# Patient Record
Sex: Male | Born: 1980 | Hispanic: Yes | Marital: Married | Smoking: Current every day smoker
Health system: Southern US, Community
[De-identification: ages and names within clinical notes are randomized; demographics above are authoritative.]

---

## 2014-06-09 ENCOUNTER — Emergency Department (HOSPITAL_COMMUNITY)
Admission: EM | Admit: 2014-06-09 | Discharge: 2014-06-09 | Disposition: A | Discharge status: Home / Self Care | Payer: Self-pay | Attending: Emergency Medicine | Admitting: Emergency Medicine

## 2014-06-09 ENCOUNTER — Encounter (HOSPITAL_COMMUNITY): Payer: Self-pay | Admitting: *Deleted

## 2014-06-09 ENCOUNTER — Emergency Department (HOSPITAL_COMMUNITY): Payer: Self-pay

## 2014-06-09 ENCOUNTER — Telehealth (HOSPITAL_COMMUNITY): Payer: Self-pay

## 2014-06-09 DIAGNOSIS — N451 Epididymitis: Secondary | ICD-10-CM | POA: Insufficient documentation

## 2014-06-09 DIAGNOSIS — R1909 Other intra-abdominal and pelvic swelling, mass and lump: Secondary | ICD-10-CM

## 2014-06-09 DIAGNOSIS — N4400 Torsion of testis, unspecified: Secondary | ICD-10-CM

## 2014-06-09 DIAGNOSIS — Z720 Tobacco use: Secondary | ICD-10-CM | POA: Insufficient documentation

## 2014-06-09 DIAGNOSIS — B3749 Other urogenital candidiasis: Secondary | ICD-10-CM | POA: Insufficient documentation

## 2014-06-09 LAB — URINALYSIS, ROUTINE W REFLEX MICROSCOPIC
BILIRUBIN URINE: NEGATIVE
Glucose, UA: NEGATIVE mg/dL
HGB URINE DIPSTICK: NEGATIVE
Ketones, ur: NEGATIVE mg/dL
LEUKOCYTES UA: NEGATIVE
NITRITE: NEGATIVE
Protein, ur: NEGATIVE mg/dL
SPECIFIC GRAVITY, URINE: 1.025 (ref 1.005–1.030)
UROBILINOGEN UA: 0.2 mg/dL (ref 0.0–1.0)
pH: 5.5 (ref 5.0–8.0)

## 2014-06-09 MED ORDER — DOXYCYCLINE HYCLATE 100 MG PO TABS
100.0000 mg | ORAL_TABLET | Freq: Once | ORAL | Status: AC
  Administered 2014-06-09: 100 mg via ORAL
  Filled 2014-06-09: qty 1

## 2014-06-09 MED ORDER — CLOTRIMAZOLE 1 % EX CREA: TOPICAL_CREAM | CUTANEOUS | Status: AC

## 2014-06-09 MED ORDER — CEFTRIAXONE SODIUM 250 MG IJ SOLR
250.0000 mg | Freq: Once | INTRAMUSCULAR | Status: AC
  Administered 2014-06-09: 250 mg via INTRAMUSCULAR
  Filled 2014-06-09: qty 250

## 2014-06-09 MED ORDER — LIDOCAINE HCL (PF) 1 % IJ SOLN: 1.0000 mL | Freq: Once | INTRAMUSCULAR | Status: DC

## 2014-06-09 MED ORDER — DOXYCYCLINE HYCLATE 100 MG PO CAPS: 100.0000 mg | ORAL_CAPSULE | Freq: Two times a day (BID) | ORAL | Status: AC

## 2014-06-09 MED ORDER — LIDOCAINE HCL (PF) 1 % IJ SOLN
INTRAMUSCULAR | Status: AC
  Administered 2014-06-09: 1 mL
  Filled 2014-06-09: qty 5

## 2014-06-09 NOTE — Telephone Encounter (Signed)
Pharmacy calling regarding pt Rx for Doxycycline.  Pt has no insurance and Rx $67.00.  Provided pharmacy # for case manager to see if qualifies for match program.

## 2014-06-09 NOTE — ED Notes (Addendum)
Pt sent here from [facility] for bil scrotal itching and swelling since yesterday. Pt states only pain was when examiner palpated L testicle.  Denies changes in urinary habits, though states hx of urinary urgency x years.

## 2014-06-09 NOTE — ED Notes (Signed)
Called lab re ua results.

## 2014-06-09 NOTE — Discharge Instructions (Signed)
Return to the emergency room with worsening of symptoms, new symptoms or with symptoms that are concerning, especially severe testicular pain, increased redness, swelling, penile discharge, worsening rash. Please take all of your antibiotics until finished!   You may develop abdominal discomfort or diarrhea from the antibiotic.  You may help offset this with probiotics which you can buy or get in yogurt. Do not eat  or take the probiotics until 2 hours after your antibiotic.  Apply antifungal cream to genitals twice daily. Please call your doctor for a followup appointment within 24-48 hours. When you talk to your doctor please let them know that you were seen in the emergency department and have them acquire all of your records so that they can discuss the findings with you and formulate a treatment plan to fully care for your new and ongoing problems. If you do not have a primary care provider please call the number below under ED resources to establish care with a provider and follow up.  Read below information and follow recommendations.   Candidiasis cutnea  (Cutaneous Candidiasis) La candidiasis cutnea es un trastorno en el que hay un desarrollo excesivo de hongos (Cndida) en la piel. Los hongos normalmente viven en la piel, pero en pequeas cantidades y no causan ningn sntoma. En ciertos casos, un mayor desarrollo de los hongos puede causar una verdadera infeccin por hongos. Este tipo de infeccin ocurre generalmente en reas de la piel que son constantemente clidas y [place], como las axilas o la ingle. El hongo es la causa ms comn de dermatitis del paal en los bebs y en personas que no pueden controlar sus movimientos intestinales (incontinencia).  CAUSAS  El hongo que causa candidiasis cutnea con ms frecuencia es Candida albicans. Las [employer] pueden aumentar el riesgo de contraer una infeccin por hongos en la piel son:   Janene Harvey.  Embarazo.  Diabetes.  Tomar  antibiticos.  Tomar pldoras anticonceptivas.  Tomar corticoides.  La enfermedad tiroidea.  Una deficiencia de hierro o zinc.  Problemas del sistema inmunolgico. SNTOMAS   Zona de la piel roja e hinchada.  Bultos en la piel.  Picazn. DIAGNSTICO  El diagnstico de la candidiasis cutnea se basa generalmente en su apariencia. Podrn realizarle unos ligeros raspados en la piel que se observarn bajo un microscopio para determinar la presencia de hongos.  TRATAMIENTO  Cremas antimicticas pueden aplicarse sobre la piel infectada. En los casos graves, pueden ser necesario tomar medicamentos por va oral.  INSTRUCCIONES PARA EL CUIDADO EN EL HOGAR   Mantenga la piel limpia y [place].  Mantenga un peso saludable.  Si tiene diabetes, mantenga bajo control el nivel de Banker. SOLICITE ATENCIN MDICA DE INMEDIATO SI:   Su erupcin contina extendindose a pesar del tratamiento.  Tiene fiebre, siente escalofros o dolor abdominal. Document Released: 12/17/2010 Document Revised: 03/22/2011 ExitCare Patient Information 2015 [place], [place]. This information is not intended to replace advice given to you by your health care provider. Make sure you discuss any questions you have with your health care provider. Epididimitis (Epididymitis) La epididimitis es una inflamacin (reaccin del organismo a una lesin o infeccin) del epiddimo. El epiddimo es [place] estructura similar una cuerda ubicada en la parte posterior de los testculos. Generalmente la causa es una infeccin, aunque no siempre. Generalmente se trata de un trastorno sbito, que comienza con escalofros, fiebre y Engineer, mining detrs del escroto y en el testculo. Puede haber hinchazn y enrojecimiento de los testculos. DIAGNSTICO El examen fsico  puede revelar un epiddimo sensible e hinchado. Los cultivos de [place] y de las secreciones prostticas ayudarn a Production assistant, radio causa de la infeccin. Algunas veces se practica  un anlisis de sangre para observar si el recuento de glbulos blancos es elevado y si se trata de una infeccin bacteriana (grmenes) o viral. Con estos datos, el profesional que lo asiste podr Psychologist, counselling un antibitico (medicamentos que destruyen los grmenes) adecuado para la infeccin bacteriana. La infeccin viral que ocasiona la epididimitis generalmente se resolver sin tratamiento. INSTRUCCIONES PARA EL CUIDADO DOMICILIARIO  Para aliviar el dolor, tome baos de asiento calientes durante 20 minutos, cuatro veces por Futures trader.  Utilice los medicamentos de venta libre o de prescripcin para Chief Technology Officer, Environmental health practitioner o la [place], segn se lo indique el profesional que lo asiste.  Tome la medicacin, incluidos los antibiticos, como se le indic. Tome los antibiticos durante todo el tiempo que le han indicado, aun si se siente mejor.  Es muy importante concurrir a todas las citas para Animator. SOLICITE ATENCIN MDICA DE INMEDIATO SI:  Tiene fiebre.  El dolor no se alivia con los [employer].  Los sntomas (problemas) que originalmente lo trajeron a Stage manager.  El dolor puede aparecer y Geneticist, molecular.  Comienza a Financial risk analyst, observa enrojecimiento e hinchazn en el escroto y en las zonas que lo rodean. EST SEGURO QUE:  Comprende las instrucciones para el alta mdica.  Controlar su enfermedad.  Solicitar atencin mdica de inmediato segn las indicaciones. Document Released: 12/28/2004 Document Revised: 03/22/2011 [facility] Patient Information 2015 [place], [place]. This information is not intended to replace advice given to you by your health care provider. Make sure you discuss any questions you have with your health care provider.    Emergency Department Resource Guide 1) Find a Doctor and Pay Out of Pocket Although you won't have to find out who is covered by your insurance plan, it is a good idea to ask around and get recommendations. You will then need to call  the office and see if the doctor you have chosen will accept you as a new patient and what types of options they offer for patients who are self-pay. Some doctors offer discounts or will set up payment plans for their patients who do not have insurance, but you will need to ask so you aren't surprised when you get to your appointment.  2) Contact Your Local Health Department Not all health departments have doctors that can see patients for sick visits, but many do, so it is worth a call to see if yours does. If you don't know where your local health department is, you can check in your phone book. The CDC also has a tool to help you locate your state's health department, and many state websites also have listings of all of their local health departments.  3) Find a Walk-in Clinic If your illness is not likely to be very severe or complicated, you may want to try a walk in clinic. These are popping up all over the country in pharmacies, drugstores, and shopping centers. They're usually staffed by nurse practitioners or physician assistants that have been trained to treat common illnesses and complaints. They're usually fairly quick and inexpensive. However, if you have serious medical issues or chronic medical problems, these are probably not your best option.  No Primary Care Doctor: - Call Health Connect at  [phone] - they can help you locate a primary care doctor that  accepts your insurance, provides certain  services, etc. - Physician Referral Service- [phone]  Chronic Pain Problems: Organization         Address  Phone   Notes  [facility]  [phone] Patients need to be referred by their primary care doctor.   Medication Assistance: Organization         Address  Phone   Notes  [facility] Medication [facility] [address] [place] [place], [place] [address] [phone] --Must be a resident of [facility] -- Must have NO insurance  coverage whatsoever (no Medicaid/ Medicare, etc.) -- The pt. MUST have a primary care doctor that directs their care regularly and follows them in the community   MedAssist  [phone]   [employer]  [phone]    Agencies that provide inexpensive medical care: Organization         Address  Phone   Notes  [facility]  [phone]   [facility]    [phone]   [facility] [address] [place], [place] [address] [phone]   [facility] [address] [place], [place] [phone]   Planned Parenthood    [phone]   [facility]    [phone]   [facility]  [address], [place] Phone:  [phone], Fax:  [phone] Hours of Operation:  9 am - 6 pm, M-F.  Also accepts Medicaid/Medicare and self-pay.  [facility]  [address], [place] Phone: [phone], Fax: [phone]. Hours of Operation:  8:30 am - 5:30 pm, M-F.  Also accepts Medicaid and self-pay.  [facility] [place] [address], [place] Phone: [phone]   [facility] [address] Natasha Bence [place], [place] [phone], Ext. [phone] Mondays & Thursdays: 7-9 AM.  First 15 patients are seen on a first come, first serve basis.    Medicaid-accepting [facility] Providers:  Organization         Address  Phone   Notes  [facility] [address], [place] [phone] Also accepts self-pay patients.  [facility] [address] Laurell [place], [place]  [phone]   [facility] [address], [place] [phone]   [facility] [address] [place] [phone]   Renaye Rakers [address], [place]   [phone] Only accepts [place] Access [place] patients after they have their  name applied to their card.   Self-Pay (no insurance) in [facility]:  Organization         Address  Phone   Notes  Sickle Cell Patients, [facility] Internal Medicine [address] [place], [place] [phone]   [facility] [address] [place], [place] [phone]   [facility] [place] [facility]  [address], [place] [phone]   [facility]/Dr. Osei-Bonsu  [address] [place] or [address], [place] [phone] Phone number for both [place] and [place] locations is the same.  Urgent Medical and [facility] [address], [place] [phone]   [facility] [address], [place] or [address] [phone]   [facility] [address],  [place] [phone], phone; [phone], fax Sees patients 1st and 3rd Saturday of every month.  Must not qualify for public or private insurance (i.e. Medicaid, Medicare, [place] Health Choice, Veterans' Benefits)  Household income should be no more than 200% of the poverty level The clinic cannot treat you if you are pregnant or think you are pregnant  Sexually transmitted diseases are not treated at the clinic.    Dental Care: Organization         Address  Phone  Notes  [facility] Department of [facility] [address] [place], [place] [phone] Accepts children up to age 70 who are enrolled in [place] or [place] Health Choice; pregnant women with a Medicaid card; and children who have applied for Medicaid or [place] Health Choice, but were declined, whose parents can pay a reduced fee at time of service.  [facility] Department of [facility]  [address], [place] [phone] Accepts children up to age 29 who are enrolled in [place] or [place] Health Choice; pregnant women with a Medicaid card; and children who have applied for  Medicaid or [place] Health Choice, but were declined, whose parents can pay a reduced fee at time of service.  [facility]  [address] [place], [place] [phone] Patients are seen by appointment only. Walk-ins are not accepted. [facility] will see patients 52 years of age and older. Monday - Tuesday (8am-5pm) Most Wednesdays (8:30-5pm) $30 per visit, cash only  [facility]  [address], [facility] [phone] Patients are seen by appointment only. Walk-ins are not accepted. [facility] will see patients 16 years of age and older. One Wednesday Evening (Monthly: Volunteer Based).  $30 per visit, cash only  [employer]  [phone] for adults; Children under age 87, call Graduate Pediatric Dentistry at [phone]. Children aged 47-14, please call [phone] to request a pediatric application.  Dental services are provided in all areas of dental care including fillings, crowns and bridges, complete and partial dentures, implants, gum treatment, root canals, and extractions. Preventive care is also provided. Treatment is provided to both adults and children. Patients are selected via a lottery and there is often a waiting list.   [facility] [address] [place]  [phone] [email or web address]   [facility] [address] [place], [place] [phone], Ext. [phone] Second and Fourth Thursday of each month, opens at 6:30 AM; Clinic ends at 9 AM.  Patients are seen on a first-come first-served basis, and a limited number are seen during each clinic.   [facility]  [address] Ether [place]ins [place], [place] [phone]   Eligibility Requirements You must have lived in [place], [place], or [place] counties for at least the last three months.   You cannot be eligible for state or federal sponsored [place], including [employer], [place],  or [employer].   You generally cannot be eligible for healthcare insurance through your employer.    How to apply: Eligibility screenings are held every Tuesday and Wednesday afternoon from 1:00 pm until 4:00 pm. You do not need an appointment for the interview!  [facility] [address], [place], [place] [phone]   [facility] Health Department  [phone]   [facility] Health Department  [phone]   [facility] Health Department  [phone]    Behavioral  Health Resources in the Community: Intensive Outpatient Programs Organization         Address  Phone  Notes  [facility] [address] [place], [place] [phone]   [facility] Outpatient [address] [place], [place] [phone]   [facility] [address] [place], [place]  [phone]   [facility] [address]  [place], [place] [phone] or [phone]   Substance Abuse Resources Organization         Address  Phone  Notes  [facility]  [phone]   [facility]  [phone]   [facility]  [phone]   Floydene Flock  [phone]   Residential & Outpatient Substance Abuse Program  [phone]   Psychological Services Organization         Address  Phone  Notes  [facility] Behavioral Health  [phone]   [facility] Services  [phone]   [facility] Mental Health [address] [place] [phone] or [phone]    Mobile Crisis Teams Organization         Address  Phone  Notes  [facility], Mobile Crisis Care Unit  [phone]   [facility]  [address] [place], [place] [phone]   Doristine Locks [address] [place] [phone]    Self-Help/Support Groups Organization         Address  Phone             Notes  [facility] [place] - variety of support groups   336- I7437963 Call for more information  Narcotics Anonymous (NA), [facility] [address], [employer]   2 meetings at this location   Statistician         Address  Phone  Notes  [facility] [address],    [place]  [phone]   [facility]  [address] [place] [address], [place], [place] [phone]   [facility] [address] [place], [place] [phone] Admissions: 8am-3pm M-F  [facility] [address],    [place], [place] [phone]   [facility] [address] Tehillah Cipriani, [place], [place] [phone]   [facility] [address]  [place], [place] [phone]   [facility] - Intensive Outpatient [address] Laurell Joseph[address], [place], [place] [phone]   [facility] ([facility] [address] [place]  [place], [place] [phone] or [phone]   [facility] ([facility]) [address] [place], [place] [phone] Accepts Medicaid  [facility] [address],  [place] [phone] Substance Abuse/Addiction Treatment   [facility] Organization         Address  Phone  Notes  [facility]  [phone]   Angie Fava, PhD [address] Ervin Knack [place], [place]   [phone] or [phone]   [facility]   [address] [place], [place] [phone]   [facility] [address] [place], [place] [phone] Insurance/Medicaid/sponsorship through [employer] and Families [address]                                    [place], [place] [phone] Therapy/tele-psych/case  [facility] [address]   [place], [place] (  336) [phone]    Dr. Lolly MustacheArfeen  [phone]) [phone]   [facility]  [employer] [facility] 1) [address], [place] 2) [address], [place] 3)  [address], [place] [phone](336) [phone]  [phone]) [phone]   [facility] Child Abuse Hotline [phone](336) [phone] or [phone](336) [phone] (After Hours)

## 2014-06-09 NOTE — ED Provider Notes (Signed)
CSN: [id number]     Arrival date & time 06/09/14  1104 History   First MD Initiated Contact with Patient 06/09/14 1122     Chief Complaint  Patient presents with  . Groin Swelling     (Consider location/radiation/quality/duration/timing/severity/associated sxs/prior Treatment) HPI  Stephen Terrell is a 34 y.o. male presenting with new onset groin swelling and erythema since yesterday evening with associated pain that started today. Patient states the swelling and erythema has improved without treatment. Patient has not taken anything for his symptoms. [facility] for [facility]. He denies fevers, chills, abdominal pain, nausea, vomiting, diarrhea. No penile lesions or discharge. No testicular lesions. Patient sexually active with wife without condom use.   History reviewed. No pertinent past medical history. History reviewed. No pertinent past surgical history. No family history on file. History  Substance Use Topics  . Smoking status: Current Every Day Smoker -- 0.25 packs/day    Types: Cigarettes  . Smokeless tobacco: Not on file  . Alcohol Use: Yes     Comment: occ    Review of Systems 10 Systems reviewed and are negative for acute change except as noted in the HPI.    Allergies  Review of patient's allergies indicates no known allergies.  Home Medications   Prior to Admission medications   Medication Sig Start Date End Date Taking? Authorizing Provider  clotrimazole (LOTRIMIN) 1 % cream Apply to affected area 2 times daily 06/09/14   [place] Anieya Helman, PA-C  doxycycline (VIBRAMYCIN) 100 MG capsule Take 1 capsule (100 mg total) by mouth 2 (two) times daily. 06/09/14   Oswaldo ConroyVictoria Axel Frisk, PA-C   BP 115/68 mmHg  Pulse 74  Temp(Src) 98.2 F (36.8 C) (Oral)  Resp 16  Ht 5\' 6"  (1.676 m)  Wt 163 lb (73.936 kg)  BMI 26.32 kg/m2  SpO2 98% Physical Exam  Constitutional: He appears well-developed and well-nourished. No distress.  HENT:   Head: Normocephalic and atraumatic.  Eyes: Conjunctivae and EOM are normal. Right eye exhibits no discharge. Left eye exhibits no discharge.  Cardiovascular: Normal rate and regular rhythm.   Pulmonary/Chest: Effort normal and breath sounds normal. No respiratory distress. He has no wheezes.  Abdominal: Soft. Bowel sounds are normal. He exhibits no distension. There is no tenderness.  Genitourinary:  Normal appearing uncircumcised penis without erythema, lesions, swelling. No penile discharge. Scrotum with edema, erythema worse on left with tenderness to palpation of L testicle. Erythematous rash in skin folds.  No penile or scrotal lesions. Normal testicular lie.   Neurological: He is alert. He exhibits normal muscle tone. Coordination normal.  Skin: Skin is warm and dry. He is not diaphoretic.  Nursing note and vitals reviewed.   ED Course  Procedures (including critical care time) Labs Review Labs Reviewed  RPR  URINALYSIS, ROUTINE W REFLEX MICROSCOPIC (NOT [facility])  HIV ANTIBODY (ROUTINE TESTING)  GC/CHLAMYDIA PROBE AMP ([place]) NOT AT [facility]    Imaging Review [place] Scrotum  06/09/2014   CLINICAL DATA:  Bilateral scrotal and testicular pain.  EXAM: SCROTAL ULTRASOUND  DOPPLER ULTRASOUND OF THE TESTICLES  TECHNIQUE: Complete ultrasound examination of the testicles, epididymis, and other scrotal structures was performed. Color and spectral Doppler ultrasound were also utilized to evaluate blood flow to the testicles.  COMPARISON:  None.  FINDINGS: Right testicle  Measurements: 4.5 x 2.3 x 3.0 cm. No mass or microlithiasis visualized.  Left testicle  Measurements: 4.6 x 1.8 x 2.9 cm. No mass  or microlithiasis visualized.  Skin of the scrotum appears diffusely thickened.  Right epididymis:  Normal in size and appearance.  Left epididymis: The left epididymis is prominent in appearance, especially in the tail region. This region also shows subjectively increased vascularity compared to the  right side and it is suspected that there is underlying acute epididymitis.  Hydrocele:  None visualized.  Varicocele:  None visualized.  Pulsed Doppler interrogation of both testes demonstrates normal low resistance arterial and venous waveforms bilaterally. Testicular vascularity appears symmetric and normal bilaterally without evidence of overt orchitis. There is no evidence of portion.  IMPRESSION: Scrotal skin thickening and ultrasound evidence of probable left epididymitis. No evidence of testicular abnormality.   Electronically Signed   By: Irish Lack M.D.   On: 06/09/2014 12:48   [place] Art/ven Flow Abd Pelv Doppler  06/09/2014   CLINICAL DATA:  Bilateral scrotal and testicular pain.  EXAM: SCROTAL ULTRASOUND  DOPPLER ULTRASOUND OF THE TESTICLES  TECHNIQUE: Complete ultrasound examination of the testicles, epididymis, and other scrotal structures was performed. Color and spectral Doppler ultrasound were also utilized to evaluate blood flow to the testicles.  COMPARISON:  None.  FINDINGS: Right testicle  Measurements: 4.5 x 2.3 x 3.0 cm. No mass or microlithiasis visualized.  Left testicle  Measurements: 4.6 x 1.8 x 2.9 cm. No mass or microlithiasis visualized.  Skin of the scrotum appears diffusely thickened.  Right epididymis:  Normal in size and appearance.  Left epididymis: The left epididymis is prominent in appearance, especially in the tail region. This region also shows subjectively increased vascularity compared to the right side and it is suspected that there is underlying acute epididymitis.  Hydrocele:  None visualized.  Varicocele:  None visualized.  Pulsed Doppler interrogation of both testes demonstrates normal low resistance arterial and venous waveforms bilaterally. Testicular vascularity appears symmetric and normal bilaterally without evidence of overt orchitis. There is no evidence of portion.  IMPRESSION: Scrotal skin thickening and ultrasound evidence of probable left epididymitis.  No evidence of testicular abnormality.   Electronically Signed   By: Irish Lack M.D.   On: 06/09/2014 12:48     EKG Interpretation None      Meds given in ED:  Medications  doxycycline (VIBRA-TABS) tablet 100 mg (100 mg Oral Given 06/09/14 1351)  cefTRIAXone (ROCEPHIN) injection 250 mg (250 mg Intramuscular Given 06/09/14 1351)  lidocaine (PF) (XYLOCAINE) 1 % injection (1 mL  Given 06/09/14 1351)    Discharge Medication List as of 06/09/2014  2:14 PM    START taking these medications   Details  clotrimazole (LOTRIMIN) 1 % cream Apply to affected area 2 times daily, Print    doxycycline (VIBRAMYCIN) 100 MG capsule Take 1 capsule (100 mg total) by mouth 2 (two) times daily., Starting 06/09/2014, Until Discontinued, Print          MDM   Final diagnoses:  Epididymitis, left  Genital candidiasis in male   Pt presenting with bilateral scrotal swelling, rash since yesterday with pain to L testicle with palpation today. VSS. No abdominal pain or complaints. Pt with scrotal swelling with tenderness to left testicle without swelling of testicle. Pt also with fungal rash. Ultrasound with evidence of likely L epidiymitis.No evidence of torsion. Will treat prophylactically for STD. Pt low risk per history. STD panel pending. Pt given referral to wellness center and list of ED resources to establish care with PCP. Patient is afebrile, nontoxic, and in no acute distress. Patient is appropriate for outpatient management and  is stable for discharge.   Discussed return precautions with patient. Discussed all results and patient verbalizes understanding and agrees with plan.    Oswaldo Conroy, PA-C 06/10/14 1028  Rolland Porter, MD 06/19/14 [phone]

## 2014-06-10 LAB — HIV ANTIBODY (ROUTINE TESTING W REFLEX): HIV SCREEN 4TH GENERATION: NONREACTIVE

## 2014-06-10 LAB — RPR: RPR Ser Ql: NONREACTIVE

## 2016-04-04 IMAGING — US US SCROTUM
1 series · 13 of 25 positions shown · non-contrast
Comparison: None.

CLINICAL DATA: Bilateral scrotal and testicular pain.

EXAM:
SCROTAL ULTRASOUND
DOPPLER ULTRASOUND OF THE TESTICLES
TECHNIQUE: Complete ultrasound examination of the testicles, epididymis, and
other scrotal structures was performed. Color and spectral Doppler
ultrasound were also utilized to evaluate blood flow to the
testicles.

[Series 1: us scrotum · 0.06mm/px · 13 of 78 slices shown]
[im 1/78]
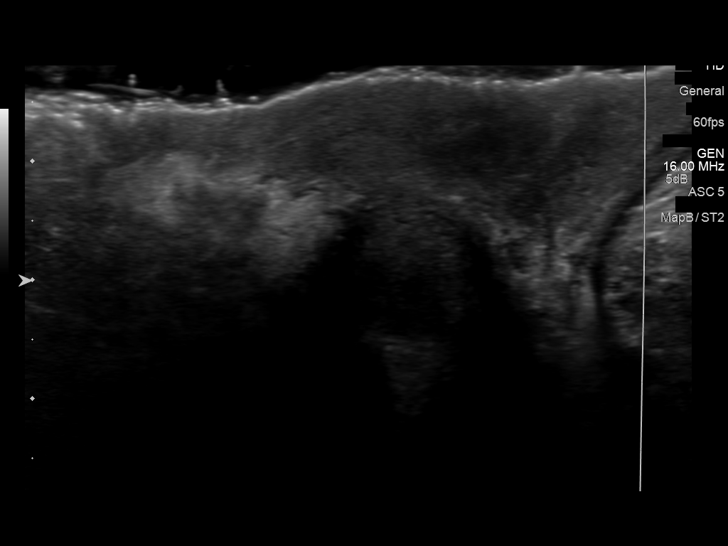
[im 7/78]
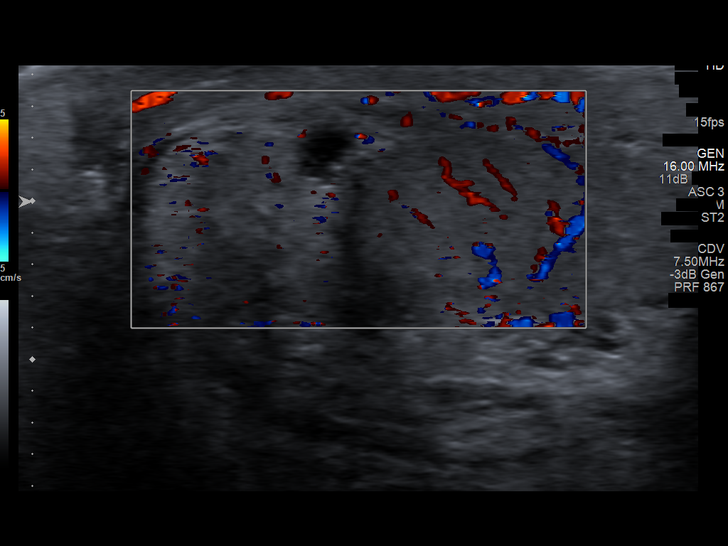
[im 13/78]
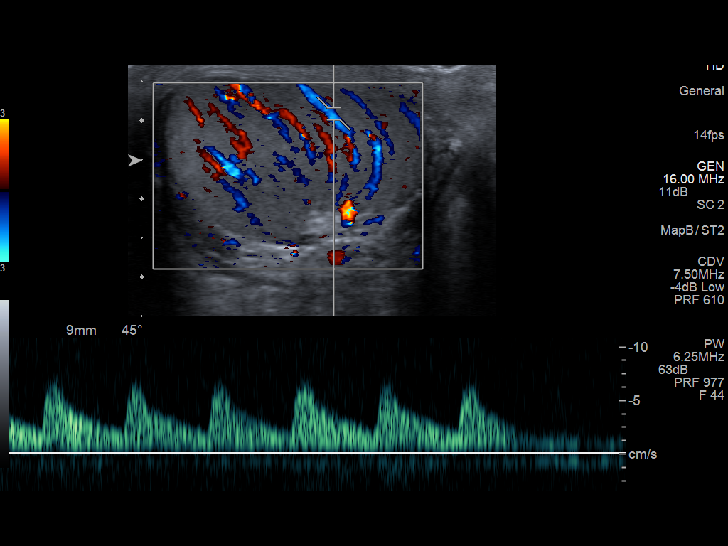
[im 20/78]
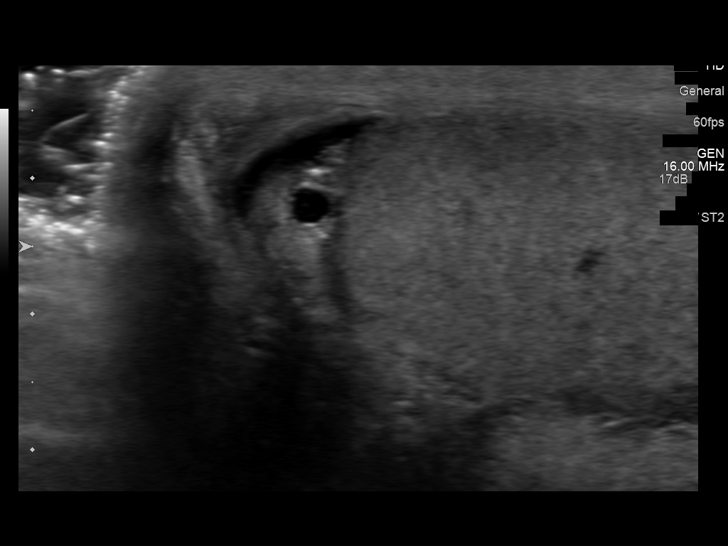
[im 26/78]
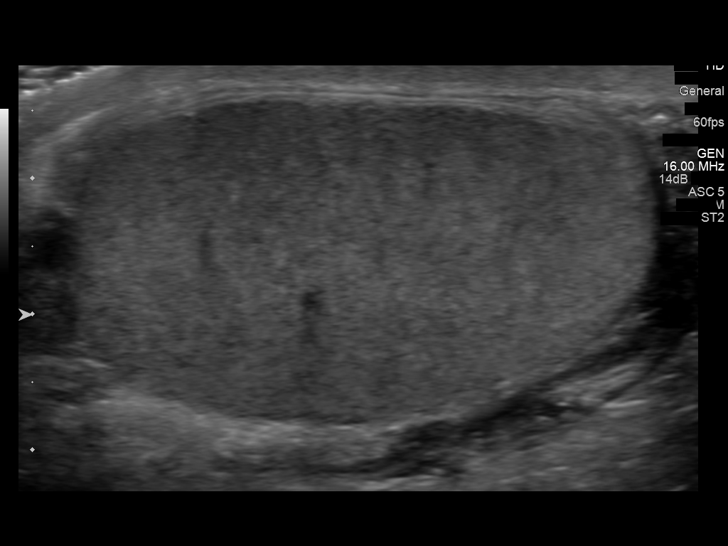
[im 33/78]
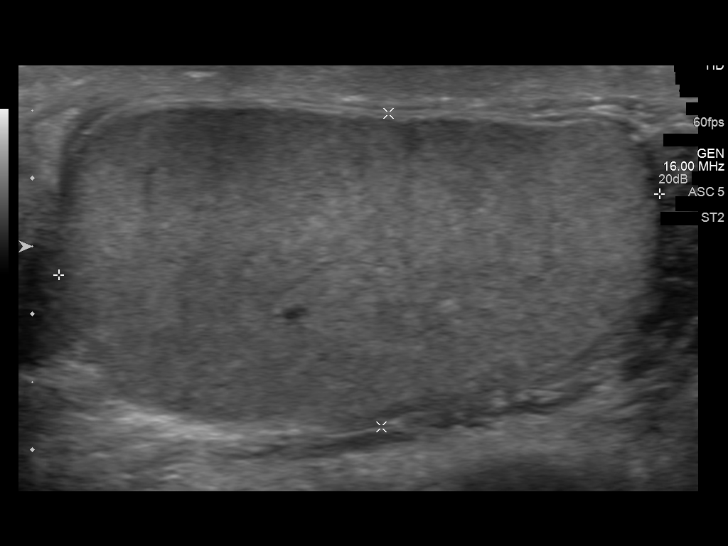
[im 39/78]
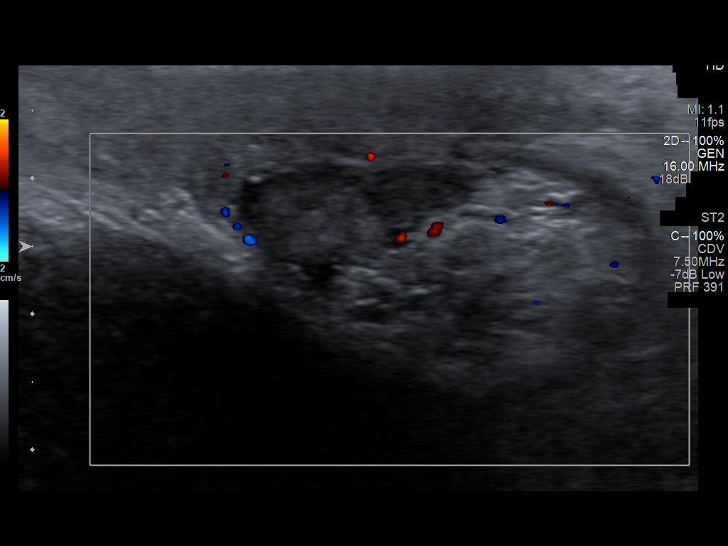
[im 45/78]
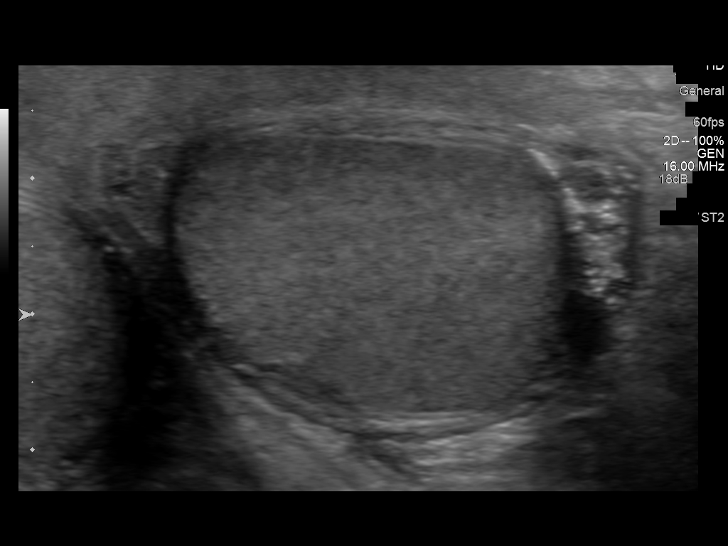
[im 52/78]
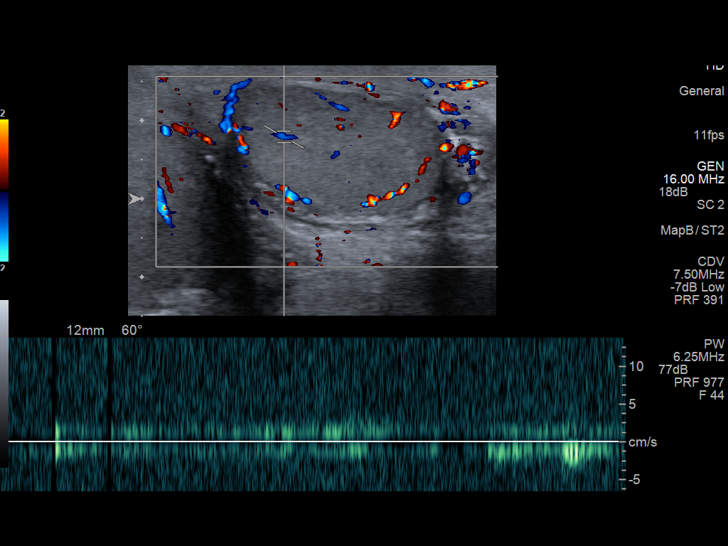
[im 58/78]
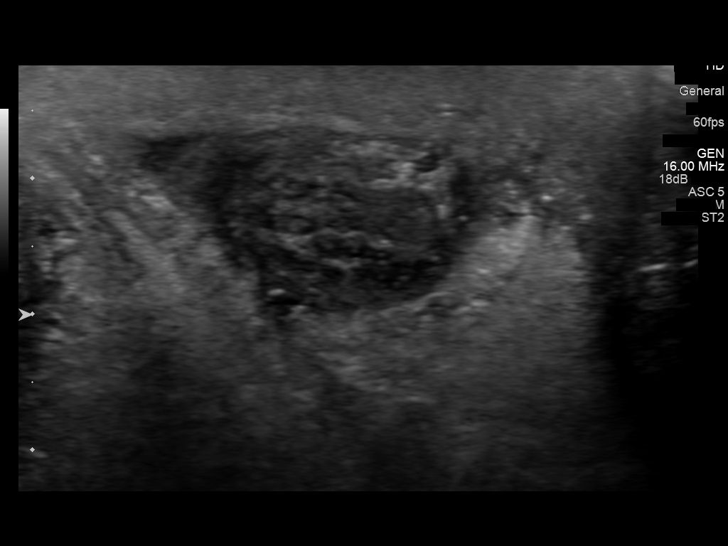
[im 65/78]
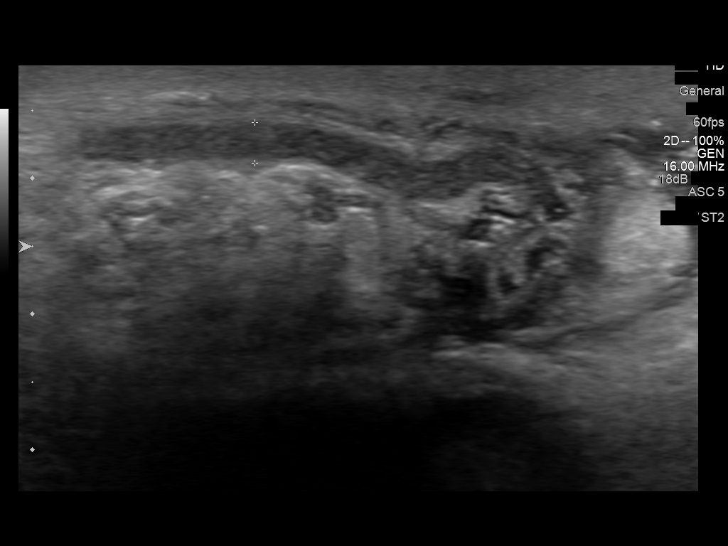
[im 71/78]
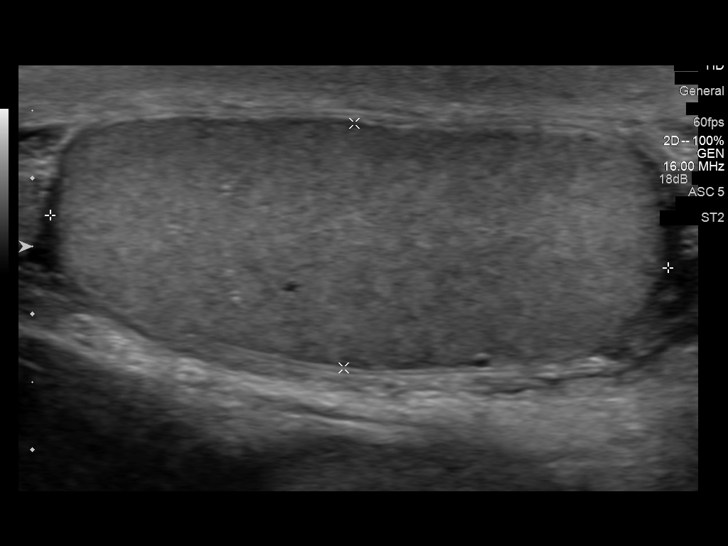
[im 78/78]
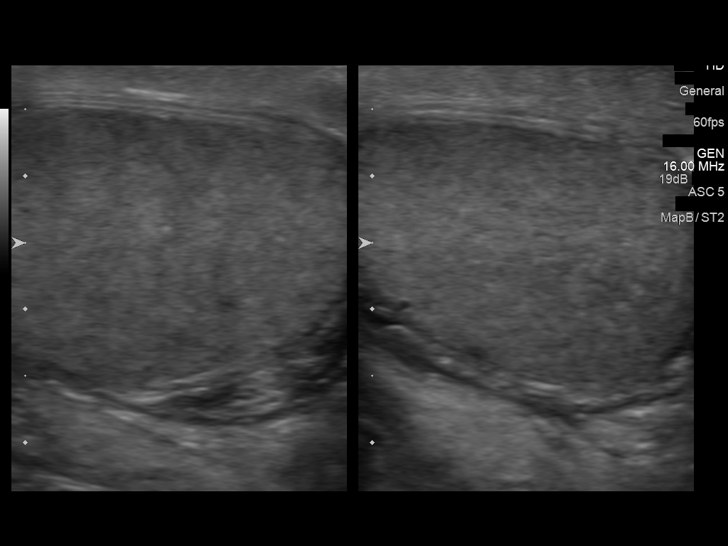

[13 of 25 positions shown; findings below may reference images not displayed]

FINDINGS: Right testicle

Measurements: 4.5 x 2.3 x 3.0 cm. No mass or microlithiasis
visualized.

Left testicle

Measurements: 4.6 x 1.8 x 2.9 cm. No mass or microlithiasis
visualized.

Skin of the scrotum appears diffusely thickened.

Right epididymis:  Normal in size and appearance.

Left epididymis: The left epididymis is prominent in appearance,
especially in the tail region. This region also shows subjectively
increased vascularity compared to the right side and it is suspected
that there is underlying acute epididymitis.

Hydrocele:  None visualized.

Varicocele:  None visualized.

Pulsed Doppler interrogation of both testes demonstrates normal low
resistance arterial and venous waveforms bilaterally. Testicular
vascularity appears symmetric and normal bilaterally without
evidence of overt orchitis. There is no evidence of portion.
IMPRESSION: Scrotal skin thickening and ultrasound evidence of probable left
epididymitis. No evidence of testicular abnormality.
